# Patient Record
Sex: Female | Born: 1995 | Hispanic: No | Smoking: Current some day smoker
Health system: Southern US, Community
[De-identification: ages and names within clinical notes are randomized; demographics above are authoritative.]

---
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
## 2016-12-31 ENCOUNTER — Encounter (HOSPITAL_BASED_OUTPATIENT_CLINIC_OR_DEPARTMENT_OTHER): Payer: Self-pay | Admitting: Emergency Medicine

## 2016-12-31 ENCOUNTER — Emergency Department (HOSPITAL_BASED_OUTPATIENT_CLINIC_OR_DEPARTMENT_OTHER): Payer: No Typology Code available for payment source

## 2016-12-31 ENCOUNTER — Emergency Department (HOSPITAL_BASED_OUTPATIENT_CLINIC_OR_DEPARTMENT_OTHER)
Admission: EM | Admit: 2016-12-31 | Discharge: 2016-12-31 | Disposition: A | Discharge status: Home / Self Care | Payer: No Typology Code available for payment source | Attending: Emergency Medicine | Admitting: Emergency Medicine

## 2016-12-31 ENCOUNTER — Other Ambulatory Visit: Payer: Self-pay

## 2016-12-31 DIAGNOSIS — M25511 Pain in right shoulder: Secondary | ICD-10-CM | POA: Insufficient documentation

## 2016-12-31 DIAGNOSIS — Y92410 Unspecified street and highway as the place of occurrence of the external cause: Secondary | ICD-10-CM | POA: Diagnosis not present

## 2016-12-31 DIAGNOSIS — Y999 Unspecified external cause status: Secondary | ICD-10-CM | POA: Diagnosis not present

## 2016-12-31 DIAGNOSIS — S161XXA Strain of muscle, fascia and tendon at neck level, initial encounter: Secondary | ICD-10-CM | POA: Insufficient documentation

## 2016-12-31 DIAGNOSIS — Y939 Activity, unspecified: Secondary | ICD-10-CM | POA: Diagnosis not present

## 2016-12-31 DIAGNOSIS — S199XXA Unspecified injury of neck, initial encounter: Secondary | ICD-10-CM | POA: Diagnosis present

## 2016-12-31 MED ORDER — CYCLOBENZAPRINE HCL 5 MG PO TABS
5.0000 mg | ORAL_TABLET | Freq: Once | ORAL | Status: AC
  Administered 2016-12-31: 5 mg via ORAL
  Filled 2016-12-31: qty 1

## 2016-12-31 MED ORDER — NAPROXEN 375 MG PO TABS: 375.0000 mg | ORAL_TABLET | Freq: Two times a day (BID) | ORAL | 0 refills | Status: DC

## 2016-12-31 MED ORDER — CYCLOBENZAPRINE HCL 10 MG PO TABS: 10.0000 mg | ORAL_TABLET | Freq: Two times a day (BID) | ORAL | 0 refills | Status: DC | PRN

## 2016-12-31 MED ORDER — IBUPROFEN 400 MG PO TABS
600.0000 mg | ORAL_TABLET | Freq: Once | ORAL | Status: AC
  Administered 2016-12-31: 600 mg via ORAL
  Filled 2016-12-31: qty 1
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
## 2016-12-31 NOTE — Discharge Instructions (Signed)
Your imaging shows no fracture.  This likely musculoskeletal pain.  I would recommend warm compresses to the affected area.  Warm soaks and Epsom salt.  Please take the Naproxen as prescribed for pain. Do not take any additional NSAIDs including Motrin, Aleve, Ibuprofen, Advil.  Please the the Flexeril For muscle relaxation. This medication will make you drowsy so avoid situation that could place you in danger.   Please follow-up with your primary care doctor or return to the ED if your symptoms worsen or not improving.

## 2016-12-31 NOTE — ED Notes (Signed)
ED Provider at bedside. 
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
## 2019-08-21 ENCOUNTER — Other Ambulatory Visit: Payer: Self-pay

## 2019-08-21 ENCOUNTER — Emergency Department (HOSPITAL_BASED_OUTPATIENT_CLINIC_OR_DEPARTMENT_OTHER)
Admission: EM | Admit: 2019-08-21 | Discharge: 2019-08-21 | Disposition: A | Discharge status: Home / Self Care | Payer: 59 | Attending: Emergency Medicine | Admitting: Emergency Medicine

## 2019-08-21 ENCOUNTER — Encounter (HOSPITAL_BASED_OUTPATIENT_CLINIC_OR_DEPARTMENT_OTHER): Payer: Self-pay | Admitting: Emergency Medicine

## 2019-08-21 DIAGNOSIS — K0889 Other specified disorders of teeth and supporting structures: Secondary | ICD-10-CM | POA: Insufficient documentation

## 2019-08-21 MED ORDER — CLINDAMYCIN HCL 300 MG PO CAPS: 300.0000 mg | ORAL_CAPSULE | Freq: Three times a day (TID) | ORAL | 0 refills | Status: AC

## 2019-08-21 NOTE — ED Triage Notes (Signed)
"   I have a really bad toothache, this am when I woke up" Also states has a hole in her tooth

## 2019-08-21 NOTE — ED Provider Notes (Signed)
[facility] EMERGENCY DEPARTMENT Provider Note   CSN: [id number] Arrival date & time: 08/21/19  7782     History Chief Complaint  Patient presents with  . Dental Pain    Brittany Lyons is a 24 y.o. female.  The history is provided by the patient.  Dental Pain Location:  Upper Quality:  Aching Severity:  Mild Onset quality:  Gradual Timing:  Intermittent Progression:  Waxing and waning Chronicity:  New Context: abscess (feels like fluid just drained from upper tooth)   Relieved by:  Nothing Worsened by:  Nothing Associated symptoms: no congestion, no difficulty swallowing, no drooling, no facial pain, no facial swelling, no fever, no gum swelling, no headaches, no neck pain, no neck swelling, no oral bleeding, no oral lesions and no trismus        History reviewed. No pertinent past medical history.  Patient Active Problem List   Diagnosis Date Noted  . Rib pain on right side 10/26/2012    History reviewed. No pertinent surgical history.   OB History   No obstetric history on file.     No family history on file.  Social History   Tobacco Use  . Smoking status: Never Smoker  . Smokeless tobacco: Never Used  Substance Use Topics  . Alcohol use: No  . Drug use: Yes    Types: Marijuana    Home Medications Prior to Admission medications   Medication Sig Start Date End Date Taking? Authorizing Provider  clindamycin (CLEOCIN) 300 MG capsule Take 1 capsule (300 mg total) by mouth 3 (three) times daily for 10 days. 08/21/19 08/31/19  Ravinder Hofland, DO  cyclobenzaprine (FLEXERIL) 10 MG tablet Take 1 tablet (10 mg total) 2 (two) times daily as needed by mouth for muscle spasms. 12/31/16   Rise Mu, PA-C  naproxen (NAPROSYN) 375 MG tablet Take 1 tablet (375 mg total) 2 (two) times daily by mouth. 12/31/16   Rise Mu, PA-C    Allergies    Patient has no known allergies.  Review of Systems   Review of Systems  Constitutional:  Negative for fever.  HENT: Negative for congestion, drooling, facial swelling and mouth sores.   Musculoskeletal: Negative for neck pain.  Neurological: Negative for headaches.    Physical Exam Updated Vital Signs BP 122/85 (BP Location: Right Arm)   Pulse 63   Temp 99 F (37.2 C) (Oral)   Resp 18   Ht 4\' 11"  (1.499 m)   Wt 45.2 kg   LMP 07/24/2019   SpO2 100%   BMI 20.12 kg/m   Physical Exam Constitutional:      General: She is not in acute distress.    Appearance: She is not ill-appearing.  HENT:     Head: Normocephalic.     Comments: Overall good dentition, no obvious ongoing abscess, some gingival disease    Nose: Nose normal.     Mouth/Throat:     Mouth: Mucous membranes are moist.  Neurological:     Mental Status: She is alert.     ED Results / Procedures / Treatments   Labs (all labs ordered are listed, but only abnormal results are displayed) Labs Reviewed - No data to display  EKG None  Radiology No results found.  Procedures Procedures (including critical care time)  Medications Ordered in ED Medications - No data to display  ED Course  I have reviewed the triage vital signs and the nursing notes.  Pertinent labs & imaging results that were  available during my care of the patient were reviewed by me and considered in my medical decision making (see chart for details).    MDM Rules/Calculators/A&P                          Brittany Lyons is a 24 year old female with no significant medical history presents to the ED with dental pain.  Patient with normal vitals.  No fever.  Felt like she had some abscess type structure above her upper front tooth that drained earlier this morning.  Patient overall appears well.  No obvious ongoing infection/abscess.  Patient overall with healthy looking teeth.  Some mild gum disease.  Will treat with antibiotics and give her resources for dentistry.  No concern for deeper space infection or facial infection.   Understands return precautions.  Discharged in good condition.  This chart was dictated using voice recognition software.  Despite best efforts to proofread,  errors can occur which can change the documentation meaning.   Final Clinical Impression(s) / ED Diagnoses Final diagnoses:  Pain, dental    Rx / DC Orders ED Discharge Orders         Ordered    clindamycin (CLEOCIN) 300 MG capsule  3 times daily     Discontinue  Reprint     08/21/19 [place], [place], DO 08/21/19 [phone]

## 2020-05-23 ENCOUNTER — Ambulatory Visit (INDEPENDENT_AMBULATORY_CARE_PROVIDER_SITE_OTHER): Payer: Self-pay | Admitting: Medical-Surgical

## 2020-05-23 ENCOUNTER — Other Ambulatory Visit (HOSPITAL_COMMUNITY)
Admission: RE | Admit: 2020-05-23 | Discharge: 2020-05-23 | Disposition: A | Discharge status: Home / Self Care | Payer: Self-pay | Source: Ambulatory Visit | Attending: Medical-Surgical | Admitting: Medical-Surgical

## 2020-05-23 ENCOUNTER — Other Ambulatory Visit: Payer: Self-pay

## 2020-05-23 ENCOUNTER — Encounter: Payer: Self-pay | Admitting: Medical-Surgical

## 2020-05-23 VITALS — BP 106/67 | HR 80 | Temp 98.6°F | Ht 58.5 in | Wt 108.5 lb

## 2020-05-23 DIAGNOSIS — Z1159 Encounter for screening for other viral diseases: Secondary | ICD-10-CM

## 2020-05-23 DIAGNOSIS — Z124 Encounter for screening for malignant neoplasm of cervix: Secondary | ICD-10-CM

## 2020-05-23 DIAGNOSIS — Z0000 Encounter for general adult medical examination without abnormal findings: Secondary | ICD-10-CM

## 2020-05-23 DIAGNOSIS — R6889 Other general symptoms and signs: Secondary | ICD-10-CM

## 2020-05-23 DIAGNOSIS — Z114 Encounter for screening for human immunodeficiency virus [HIV]: Secondary | ICD-10-CM

## 2020-05-23 NOTE — Patient Instructions (Addendum)
Preventive Care 21-26 Years Old, Female Preventive care refers to lifestyle choices and visits with your health care provider that can promote health and wellness. This includes:  A yearly physical exam. This is also called an annual wellness visit.  Regular dental and eye exams.  Immunizations.  Screening for certain conditions.  Healthy lifestyle choices, such as: ? Eating a healthy diet. ? Getting regular exercise. ? Not using drugs or products that contain nicotine and tobacco. ? Limiting alcohol use. What can I expect for my preventive care visit? Physical exam Your health care provider may check your:  Height and weight. These may be used to calculate your BMI (body mass index). BMI is a measurement that tells if you are at a healthy weight.  Heart rate and blood pressure.  Body temperature.  Skin for abnormal spots. Counseling Your health care provider may ask you questions about your:  Past medical problems.  Family's medical history.  Alcohol, tobacco, and drug use.  Emotional well-being.  Home life and relationship well-being.  Sexual activity.  Diet, exercise, and sleep habits.  Work and work environment.  Access to firearms.  Method of birth control.  Menstrual cycle.  Pregnancy history. What immunizations do I need? Vaccines are usually given at various ages, according to a schedule. Your health care provider will recommend vaccines for you based on your age, medical history, and lifestyle or other factors, such as travel or where you work.   What tests do I need? Blood tests  Lipid and cholesterol levels. These may be checked every 5 years starting at age 20.  Hepatitis C test.  Hepatitis B test. Screening  Diabetes screening. This is done by checking your blood sugar (glucose) after you have not eaten for a while (fasting).  STD (sexually transmitted disease) testing, if you are at risk.  BRCA-related cancer screening. This may be  done if you have a family history of breast, ovarian, tubal, or peritoneal cancers.  Pelvic exam and Pap test. This may be done every 3 years starting at age 21. Starting at age 30, this may be done every 5 years if you have a Pap test in combination with an HPV test. Talk with your health care provider about your test results, treatment options, and if necessary, the need for more tests.   Follow these instructions at home: Eating and drinking  Eat a healthy diet that includes fresh fruits and vegetables, whole grains, lean protein, and low-fat dairy products.  Take vitamin and mineral supplements as recommended by your health care provider.  Do not drink alcohol if: ? Your health care provider tells you not to drink. ? You are pregnant, may be pregnant, or are planning to become pregnant.  If you drink alcohol: ? Limit how much you have to 0-1 drink a day. ? Be aware of how much alcohol is in your drink. In the [place] one drink equals one 12 oz bottle of beer (355 mL), one 5 oz glass of wine (148 mL), or one 1 oz glass of hard liquor (44 mL).   Lifestyle  Take daily care of your teeth and gums. Brush your teeth every morning and night with fluoride toothpaste. Floss one time each day.  Stay active. Exercise for at least 30 minutes 5 or more days each week.  Do not use any products that contain nicotine or tobacco, such as cigarettes, e-cigarettes, and chewing tobacco. If you need help quitting, ask your health care provider.  Do not   use drugs.  If you are sexually active, practice safe sex. Use a condom or other form of protection to prevent STIs (sexually transmitted infections).  If you do not wish to become pregnant, use a form of birth control. If you plan to become pregnant, see your health care provider for a prepregnancy visit.  Find healthy ways to cope with stress, such as: ? Meditation, yoga, or listening to music. ? Journaling. ? Talking to a trusted  person. ? Spending time with friends and family. Safety  Always wear your seat belt while driving or riding in a vehicle.  Do not drive: ? If you have been drinking alcohol. Do not ride with someone who has been drinking. ? When you are tired or distracted. ? While texting.  Wear a helmet and other protective equipment during sports activities.  If you have firearms in your house, make sure you follow all gun safety procedures.  Seek help if you have been physically or sexually abused. What's next?  Go to your health care provider once a year for an annual wellness visit.  Ask your health care provider how often you should have your eyes and teeth checked.  Stay up to date on all vaccines. This information is not intended to replace advice given to you by your health care provider. Make sure you discuss any questions you have with your health care provider. Document Revised: 10/09/2019 Document Reviewed: 10/22/2017 Elsevier Patient Education  2021 Elsevier Inc.  

## 2020-05-23 NOTE — Progress Notes (Signed)
New Patient Office Visit  Subjective:  Patient ID: Brittany Lyons, female    DOB: February 07, 1996  Age: 25 y.o. MRN: [id number]  CC:  Chief Complaint  Patient presents with  . Establish Care  . Annual Exam    Including Pap smear    HPI Brittany Lyons presents to establish care.   Would like to have her pap smear completed today. Has never had a pap smear. She is sexually active but has only had 1 female partner. Recently has had female partners. No current vaginal symptoms or concerns but would like to add STI testing onto pap if possible.   Smokes marijuana approximately 5 days per week. Reports this helps with her anxiety.   Would like to be checked for anemia because she stays cold all the time. Also has difficulty gaining weight. Reports that her mom has thyroid problems. Unsure about her other family history since her mom was adopted.   History reviewed. No pertinent past medical history.  History reviewed. No pertinent surgical history.  Family History  Problem Relation Age of Onset  . Thyroid disease Mother     Social History   Socioeconomic History  . Marital status: Not on file    Spouse name: Not on file  . Number of children: Not on file  . Years of education: Not on file  . Highest education level: Not on file  Occupational History  . Not on file  Tobacco Use  . Smoking status: Current Some Day Smoker  . Smokeless tobacco: Never Used  Substance and Sexual Activity  . Alcohol use: Not Currently  . Drug use: Yes    Frequency: 5.0 times per week    Types: Marijuana  . Sexual activity: Yes    Birth control/protection: None  Other Topics Concern  . Not on file  Social History Narrative  . Not on file   Social Determinants of Health   Financial Resource Strain: Not on file  Food Insecurity: Not on file  Transportation Needs: Not on file  Physical Activity: Not on file  Stress: Not on file  Social Connections: Not on file  Intimate Partner Violence: Not  on file    ROS Review of Systems  Objective:   Today's Vitals: BP 106/67   Pulse 80   Temp 98.6 F (37 C)   Ht 4' 10.5" (1.486 m)   Wt 108 lb 8 oz (49.2 kg)   LMP 05/18/2020   SpO2 97%   BMI 22.29 kg/m   Physical Exam Vitals reviewed. Exam conducted with a chaperone present.  Constitutional:      General: She is not in acute distress.    Appearance: Normal appearance. She is normal weight. She is not ill-appearing.  HENT:     Head: Normocephalic and atraumatic.  Eyes:     Extraocular Movements: Extraocular movements intact.     Conjunctiva/sclera: Conjunctivae normal.     Pupils: Pupils are equal, round, and reactive to light.  Cardiovascular:     Rate and Rhythm: Normal rate and regular rhythm.     Pulses: Normal pulses.     Heart sounds: Normal heart sounds. No murmur heard. No friction rub. No gallop.   Pulmonary:     Effort: Pulmonary effort is normal. No respiratory distress.     Breath sounds: Normal breath sounds. No wheezing.  Abdominal:     General: Abdomen is flat. Bowel sounds are normal.     Palpations: Abdomen is soft.  Genitourinary:  Exam position: Lithotomy position.     Pubic Area: No rash or pubic lice.      Labia:        Right: No rash, tenderness, lesion or injury.        Left: No rash, tenderness, lesion or injury.      Urethra: No prolapse, urethral pain, urethral swelling or urethral lesion.     Vagina: Normal.     Cervix: Normal.     Uterus: Normal.      Adnexa: Right adnexa normal and left adnexa normal.     Comments: Pap smear completed. Glennie Hawk, MA served as Producer, television/film/video.  Musculoskeletal:        General: Normal range of motion.     Cervical back: Normal range of motion and neck supple. No rigidity or tenderness.  Skin:    General: Skin is warm and dry.  Neurological:     Mental Status: She is alert and oriented to person, place, and time.  Psychiatric:        Mood and Affect: Mood normal.        Behavior: Behavior  normal.        Thought Content: Thought content normal.        Judgment: Judgment normal.     Assessment & Plan:   1. Encounter for medical examination to establish care Reviewed available information and discussed healthcare concerns with patient.  2. Cold intolerance Checking CBC with differential and TSH. - CBC with Differential/Platelet - TSH  3. Screening for cervical cancer Pap smear completed with STI testing added per patient request. - Cytology - PAP  4. Screening for HIV (human immunodeficiency virus) 5. Need for hepatitis C screening test Discussed screening recommendations.  Patient agreeable so adding to blood work today. - Hepatitis C antibody - HIV Antibody (routine testing w rflx)  6. Annual physical exam Checking CBC with differential, CMP, and lipid panel today. - CBC with Differential/Platelet - COMPLETE METABOLIC PANEL WITH GFR - Lipid panel  No outpatient encounter medications on file as of 05/23/2020.   No facility-administered encounter medications on file as of 05/23/2020.    Patient Instructions   Preventive Care 97-22 Years Old, Female Preventive care refers to lifestyle choices and visits with your health care provider that can promote health and wellness. This includes:  A yearly physical exam. This is also called an annual wellness visit.  Regular dental and eye exams.  Immunizations.  Screening for certain conditions.  Healthy lifestyle choices, such as: ? Eating a healthy diet. ? Getting regular exercise. ? Not using drugs or products that contain nicotine and tobacco. ? Limiting alcohol use. What can I expect for my preventive care visit? Physical exam Your health care provider may check your:  Height and weight. These may be used to calculate your BMI (body mass index). BMI is a measurement that tells if you are at a healthy weight.  Heart rate and blood pressure.  Body temperature.  Skin for abnormal  spots. Counseling Your health care provider may ask you questions about your:  Past medical problems.  Family's medical history.  Alcohol, tobacco, and drug use.  Emotional well-being.  Home life and relationship well-being.  Sexual activity.  Diet, exercise, and sleep habits.  Work and work Statistician.  Access to firearms.  Method of birth control.  Menstrual cycle.  Pregnancy history. What immunizations do I need? Vaccines are usually given at various ages, according to a schedule. Your health care provider will recommend  vaccines for you based on your age, medical history, and lifestyle or other factors, such as travel or where you work.   What tests do I need? Blood tests  Lipid and cholesterol levels. These may be checked every 5 years starting at age 39.  Hepatitis C test.  Hepatitis B test. Screening  Diabetes screening. This is done by checking your blood sugar (glucose) after you have not eaten for a while (fasting).  STD (sexually transmitted disease) testing, if you are at risk.  BRCA-related cancer screening. This may be done if you have a family history of breast, ovarian, tubal, or peritoneal cancers.  Pelvic exam and Pap test. This may be done every 3 years starting at age 53. Starting at age 52, this may be done every 5 years if you have a Pap test in combination with an HPV test. Talk with your health care provider about your test results, treatment options, and if necessary, the need for more tests.   Follow these instructions at home: Eating and drinking  Eat a healthy diet that includes fresh fruits and vegetables, whole grains, lean protein, and low-fat dairy products.  Take vitamin and mineral supplements as recommended by your health care provider.  Do not drink alcohol if: ? Your health care provider tells you not to drink. ? You are pregnant, may be pregnant, or are planning to become pregnant.  If you drink alcohol: ? Limit how  much you have to 0-1 drink a day. ? Be aware of how much alcohol is in your drink. In the [place] one drink equals one 12 oz bottle of beer (355 mL), one 5 oz glass of wine (148 mL), or one 1 oz glass of hard liquor (44 mL).   Lifestyle  Take daily care of your teeth and gums. Brush your teeth every morning and night with fluoride toothpaste. Floss one time each day.  Stay active. Exercise for at least 30 minutes 5 or more days each week.  Do not use any products that contain nicotine or tobacco, such as cigarettes, e-cigarettes, and chewing tobacco. If you need help quitting, ask your health care provider.  Do not use drugs.  If you are sexually active, practice safe sex. Use a condom or other form of protection to prevent STIs (sexually transmitted infections).  If you do not wish to become pregnant, use a form of birth control. If you plan to become pregnant, see your health care provider for a prepregnancy visit.  Find healthy ways to cope with stress, such as: ? Meditation, yoga, or listening to music. ? Journaling. ? Talking to a trusted person. ? Spending time with friends and family. Safety  Always wear your seat belt while driving or riding in a vehicle.  Do not drive: ? If you have been drinking alcohol. Do not ride with someone who has been drinking. ? When you are tired or distracted. ? While texting.  Wear a helmet and other protective equipment during sports activities.  If you have firearms in your house, make sure you follow all gun safety procedures.  Seek help if you have been physically or sexually abused. What's next?  Go to your health care provider once a year for an annual wellness visit.  Ask your health care provider how often you should have your eyes and teeth checked.  Stay up to date on all vaccines. This information is not intended to replace advice given to you by your health care provider. Make sure  you discuss any questions you have with your  health care provider. Document Revised: 10/09/2019 Document Reviewed: 10/22/2017 Elsevier Patient Education  2021 [place].  Follow-up: Return if symptoms worsen or fail to improve.  Further follow-up pending lab results.  Clearnce Sorrel, DNP, APRN, FNP-BC [facility]

## 2020-05-28 LAB — CYTOLOGY - PAP
Adequacy: ABSENT
Chlamydia: POSITIVE — AB
Comment: NEGATIVE
Comment: NEGATIVE
Comment: NEGATIVE
Comment: NORMAL
Diagnosis: NEGATIVE
HSV1: NEGATIVE
HSV2: NEGATIVE
Neisseria Gonorrhea: NEGATIVE
Trichomonas: NEGATIVE

## 2020-05-28 MED ORDER — AZITHROMYCIN 250 MG PO TABS: ORAL_TABLET | ORAL | 0 refills | Status: DC

## 2020-05-28 NOTE — Addendum Note (Signed)
Addended byChristen Butter on: 05/28/2020 05:04 PM   Modules accepted: Orders

## 2020-06-17 ENCOUNTER — Other Ambulatory Visit: Payer: Self-pay | Admitting: Medical-Surgical

## 2020-06-24 ENCOUNTER — Emergency Department (HOSPITAL_BASED_OUTPATIENT_CLINIC_OR_DEPARTMENT_OTHER)
Admission: EM | Admit: 2020-06-24 | Discharge: 2020-06-24 | Disposition: A | Discharge status: Home / Self Care | Payer: Self-pay | Attending: Emergency Medicine | Admitting: Emergency Medicine

## 2020-06-24 ENCOUNTER — Encounter (HOSPITAL_BASED_OUTPATIENT_CLINIC_OR_DEPARTMENT_OTHER): Payer: Self-pay

## 2020-06-24 ENCOUNTER — Other Ambulatory Visit: Payer: Self-pay

## 2020-06-24 DIAGNOSIS — Z711 Person with feared health complaint in whom no diagnosis is made: Secondary | ICD-10-CM

## 2020-06-24 DIAGNOSIS — Z202 Contact with and (suspected) exposure to infections with a predominantly sexual mode of transmission: Secondary | ICD-10-CM | POA: Insufficient documentation

## 2020-06-24 DIAGNOSIS — R102 Pelvic and perineal pain: Secondary | ICD-10-CM | POA: Insufficient documentation

## 2020-06-24 LAB — URINALYSIS, ROUTINE W REFLEX MICROSCOPIC
Bilirubin Urine: NEGATIVE
Glucose, UA: NEGATIVE mg/dL
Hgb urine dipstick: NEGATIVE
Ketones, ur: NEGATIVE mg/dL
Nitrite: NEGATIVE
Protein, ur: NEGATIVE mg/dL
Specific Gravity, Urine: 1.02 (ref 1.005–1.030)
pH: 6.5 (ref 5.0–8.0)

## 2020-06-24 LAB — WET PREP, GENITAL
Clue Cells Wet Prep HPF POC: NONE SEEN
Sperm: NONE SEEN
Trich, Wet Prep: NONE SEEN
Yeast Wet Prep HPF POC: NONE SEEN

## 2020-06-24 LAB — URINALYSIS, MICROSCOPIC (REFLEX)

## 2020-06-24 LAB — PREGNANCY, URINE: Preg Test, Ur: NEGATIVE

## 2020-06-24 MED ORDER — ONDANSETRON 4 MG PO TBDP
4.0000 mg | ORAL_TABLET | Freq: Once | ORAL | Status: AC
  Administered 2020-06-24: 4 mg via ORAL
  Filled 2020-06-24: qty 1

## 2020-06-24 MED ORDER — CEFTRIAXONE SODIUM 500 MG IJ SOLR
500.0000 mg | Freq: Once | INTRAMUSCULAR | Status: AC
  Administered 2020-06-24: 500 mg via INTRAMUSCULAR
  Filled 2020-06-24: qty 500

## 2020-06-24 MED ORDER — DOXYCYCLINE HYCLATE 100 MG PO CAPS: 100.0000 mg | ORAL_CAPSULE | Freq: Two times a day (BID) | ORAL | 0 refills | Status: AC

## 2020-06-24 MED ORDER — LIDOCAINE HCL (PF) 1 % IJ SOLN
1.0000 mL | Freq: Once | INTRAMUSCULAR | Status: AC
  Administered 2020-06-24: 1 mL
  Filled 2020-06-24: qty 5

## 2020-06-24 MED ORDER — DOXYCYCLINE HYCLATE 100 MG PO TABS
100.0000 mg | ORAL_TABLET | Freq: Once | ORAL | Status: AC
  Administered 2020-06-24: 100 mg via ORAL
  Filled 2020-06-24: qty 1

## 2020-06-24 NOTE — ED Triage Notes (Signed)
Needs follow up check for recent positive STD.   Lower abdominal pain x1 week  Denies n/v/d

## 2020-06-24 NOTE — ED Provider Notes (Signed)
[facility] EMERGENCY DEPARTMENT Provider Note   CSN: [id number] Arrival date & time: 06/24/20  1657     History Chief Complaint  Patient presents with  . Abdominal Pain    Brittany Lyons is a 25 y.o. female who presents today for evaluation of recheck for positive STD.  She states that about 3 weeks ago she was treated for chlamydia however she vomited the pills.  She states that she called her doctor who told her she needed to wait 3 weeks and then get retested.  She states that it has been about 4 weeks however she has had lower abdominal pain for 1 week.  No diarrhea, nausea or vomiting aside from when she took the antibiotics.  She denies any fevers.  She states that she has not been sexually active since she tried to take the treatment.  She denies any dysuria, frequency or urgency. She declines testing for HIV and syphilis.  HPI     No past medical history on file.  Patient Active Problem List   Diagnosis Date Noted  . Rib pain on right side 10/26/2012    No past surgical history on file.   OB History   No obstetric history on file.     No family history on file.  Social History   Tobacco Use  . Smoking status: Never Smoker  . Smokeless tobacco: Never Used  Vaping Use  . Vaping Use: Never used  Substance Use Topics  . Alcohol use: No  . Drug use: Yes    Frequency: 7.0 times per week    Types: Marijuana    Comment: smokes daily    Home Medications Prior to Admission medications   Medication Sig Start Date End Date Taking? Authorizing Provider  doxycycline (VIBRAMYCIN) 100 MG capsule Take 1 capsule (100 mg total) by mouth 2 (two) times daily for 7 days. 06/24/20 07/01/20 Yes Cristina Gong, PA-C  cyclobenzaprine (FLEXERIL) 10 MG tablet Take 1 tablet (10 mg total) 2 (two) times daily as needed by mouth for muscle spasms. 12/31/16   Rise Mu, PA-C  naproxen (NAPROSYN) 375 MG tablet Take 1 tablet (375 mg total) 2 (two) times daily by  mouth. 12/31/16   Rise Mu, PA-C    Allergies    Patient has no known allergies.  Review of Systems   Review of Systems  Constitutional: Negative for chills and fever.  Respiratory: Negative for shortness of breath and stridor.   Cardiovascular: Negative for chest pain.  Gastrointestinal: Positive for abdominal pain. Negative for diarrhea, nausea and vomiting.  Genitourinary: Positive for pelvic pain. Negative for dysuria, frequency, genital sores, hematuria, menstrual problem, urgency, vaginal bleeding, vaginal discharge and vaginal pain.  Musculoskeletal: Negative for back pain and neck pain.  Neurological: Negative for headaches.  All other systems reviewed and are negative.   Physical Exam Updated Vital Signs BP 118/81 (BP Location: Right Arm)   Pulse 67   Temp 98 F (36.7 C) (Oral)   Resp 16   Ht 4\' 11"  (1.499 m)   Wt 52.2 kg   SpO2 100%   BMI 23.23 kg/m   Physical Exam Vitals and nursing note reviewed. Exam conducted with a chaperone present (Female RN).  Constitutional:      General: She is not in acute distress.    Appearance: She is not diaphoretic.  HENT:     Head: Normocephalic and atraumatic.  Eyes:     General: No scleral icterus.  Right eye: No discharge.        Left eye: No discharge.     Conjunctiva/sclera: Conjunctivae normal.  Cardiovascular:     Rate and Rhythm: Normal rate and regular rhythm.  Pulmonary:     Effort: Pulmonary effort is normal. No respiratory distress.     Breath sounds: No stridor.  Abdominal:     General: Bowel sounds are normal. There is no distension.     Tenderness: There is no abdominal tenderness. There is no guarding or rebound.     Hernia: No hernia is present.  Genitourinary:    Comments: Normal external female genitalia.  There is no adnexal tenderness or fullness.  There is no cervical motion tenderness.  There is a moderate amount of cervical discharge. Musculoskeletal:        General: No deformity.      Cervical back: Normal range of motion.  Skin:    General: Skin is warm and dry.  Neurological:     Mental Status: She is alert.     Motor: No abnormal muscle tone.  Psychiatric:        Behavior: Behavior normal.     ED Results / Procedures / Treatments   Labs (all labs ordered are listed, but only abnormal results are displayed) Labs Reviewed  WET PREP, GENITAL - Abnormal; Notable for the following components:      Result Value   WBC, Wet Prep HPF POC MANY (*)    All other components within normal limits  URINALYSIS, ROUTINE W REFLEX MICROSCOPIC - Abnormal; Notable for the following components:   Leukocytes,Ua SMALL (*)    All other components within normal limits  URINALYSIS, MICROSCOPIC (REFLEX) - Abnormal; Notable for the following components:   Bacteria, UA FEW (*)    All other components within normal limits  PREGNANCY, URINE  GC/CHLAMYDIA PROBE AMP ([place]) NOT AT [facility]    EKG None  Radiology No results found.  Procedures Procedures   Medications Ordered in ED Medications  cefTRIAXone (ROCEPHIN) injection 500 mg (500 mg Intramuscular Given 06/24/20 1856)  lidocaine (PF) (XYLOCAINE) 1 % injection 1 mL (1 mL Other Given 06/24/20 1856)  ondansetron (ZOFRAN-ODT) disintegrating tablet 4 mg (4 mg Oral Given 06/24/20 1855)  doxycycline (VIBRA-TABS) tablet 100 mg (100 mg Oral Given 06/24/20 1855)    ED Course  I have reviewed the triage vital signs and the nursing notes.  Pertinent labs & imaging results that were available during my care of the patient were reviewed by me and considered in my medical decision making (see chart for details).    MDM Rules/Calculators/A&P                         Patient is a 25 year old woman who presents today for evaluation of pelvic pain.  She had a recent positive STD however states that she threw up the pills. On exam she is awake and alert, generally well-appearing.  Abdomen is soft, nontender, nondistended.  Pelvic exam  performed without cervical motion tenderness, adnexal tenderness or pain. UA without evidence of infection, pregnancy test is negative.  Wet prep is obtained showing many white blood cells without clear cause.  GC testing is sent.  Given that patient did not clearly have full treatment after positive test, combined with her having many white cells on her wet prep she is given IM Rocephin and a prescription for doxycycline to finish out the course.  Doubt PID at this time.  Return precautions were discussed with patient who states their understanding.  At the time of discharge patient denied any unaddressed complaints or concerns.  Patient is agreeable for discharge home.  Note: Portions of this report may have been transcribed using voice recognition software. Every effort was made to ensure accuracy; however, inadvertent computerized transcription errors may be present  Final Clinical Impression(s) / ED Diagnoses Final diagnoses:  Concern about STD in female without diagnosis    Rx / DC Orders ED Discharge Orders         Ordered    doxycycline (VIBRAMYCIN) 100 MG capsule  2 times daily        06/24/20 1913           Norman Clay 06/25/20 0111    Sabino Donovan, MD 06/29/20 [phone]

## 2020-06-24 NOTE — ED Notes (Signed)
Chaperone for EDP for pelvic exam. Pt tolerated well.  ?

## 2020-06-24 NOTE — ED Provider Notes (Incomplete)
  [facility] EMERGENCY DEPARTMENT Provider Note   CSN: [id number] Arrival date & time: 06/24/20  1657     History No chief complaint on file.   Brittany Lyons is a 25 y.o. female   HPI     No past medical history on file.  Patient Active Problem List   Diagnosis Date Noted  . Rib pain on right side 10/26/2012    No past surgical history on file.   OB History   No obstetric history on file.     No family history on file.  Social History   Tobacco Use  . Smoking status: Never Smoker  . Smokeless tobacco: Never Used  Vaping Use  . Vaping Use: Never used  Substance Use Topics  . Alcohol use: No  . Drug use: Yes    Frequency: 7.0 times per week    Types: Marijuana    Comment: smokes daily    Home Medications Prior to Admission medications   Medication Sig Start Date End Date Taking? Authorizing Provider  cyclobenzaprine (FLEXERIL) 10 MG tablet Take 1 tablet (10 mg total) 2 (two) times daily as needed by mouth for muscle spasms. 12/31/16   Rise Mu, PA-C  naproxen (NAPROSYN) 375 MG tablet Take 1 tablet (375 mg total) 2 (two) times daily by mouth. 12/31/16   Rise Mu, PA-C    Allergies    Patient has no known allergies.  Review of Systems   Review of Systems  Physical Exam Updated Vital Signs BP 113/85 (BP Location: Left Arm)   Pulse 80   Temp 98 F (36.7 C) (Oral)   Resp 16   Ht 4\' 11"  (1.499 m)   Wt 52.2 kg   SpO2 98%   BMI 23.23 kg/m   Physical Exam  ED Results / Procedures / Treatments   Labs (all labs ordered are listed, but only abnormal results are displayed) Labs Reviewed - No data to display  EKG None  Radiology No results found.  Procedures Procedures {Remember to document critical care time when appropriate:1}  Medications Ordered in ED Medications - No data to display  ED Course  I have reviewed the triage vital signs and the nursing notes.  Pertinent labs & imaging results that were  available during my care of the patient were reviewed by me and considered in my medical decision making (see chart for details).    MDM Rules/Calculators/A&P                          *** Final Clinical Impression(s) / ED Diagnoses Final diagnoses:  None    Rx / DC Orders ED Discharge Orders    None

## 2020-06-24 NOTE — Discharge Instructions (Addendum)
You may have diarrhea from the antibiotics.  It is very important that you continue to take the antibiotics even if you get diarrhea unless a medical professional tells you that you may stop taking them.  If you stop too early the bacteria you are being treated for will become stronger and you may need different, more powerful antibiotics that have more side effects and worsening diarrhea.  Please stay well hydrated and consider probiotics as they may decrease the severity of your diarrhea.  Please be aware that if you take any hormonal contraception (birth control pills, nexplanon, the ring, etc) that your birth control will not work while you are taking antibiotics and you need to use back up protection as directed on the birth control medication information insert.   If you develop any fevers, have any new or concerning symptoms please seek additional medical care and evaluation.

## 2020-06-26 ENCOUNTER — Encounter (HOSPITAL_BASED_OUTPATIENT_CLINIC_OR_DEPARTMENT_OTHER): Payer: Self-pay

## 2020-06-26 LAB — GC/CHLAMYDIA PROBE AMP (~~LOC~~) NOT AT ARMC
Chlamydia: NEGATIVE
Comment: NEGATIVE
Comment: NORMAL
Neisseria Gonorrhea: POSITIVE — AB

## 2020-08-06 ENCOUNTER — Encounter (HOSPITAL_BASED_OUTPATIENT_CLINIC_OR_DEPARTMENT_OTHER): Payer: Self-pay | Admitting: Urology

## 2020-08-06 ENCOUNTER — Other Ambulatory Visit (HOSPITAL_BASED_OUTPATIENT_CLINIC_OR_DEPARTMENT_OTHER): Payer: Self-pay

## 2020-08-06 ENCOUNTER — Other Ambulatory Visit: Payer: Self-pay

## 2020-08-06 ENCOUNTER — Emergency Department (HOSPITAL_BASED_OUTPATIENT_CLINIC_OR_DEPARTMENT_OTHER)
Admission: EM | Admit: 2020-08-06 | Discharge: 2020-08-06 | Disposition: A | Discharge status: Home / Self Care | Payer: Self-pay | Attending: Emergency Medicine | Admitting: Emergency Medicine

## 2020-08-06 DIAGNOSIS — J029 Acute pharyngitis, unspecified: Secondary | ICD-10-CM | POA: Insufficient documentation

## 2020-08-06 DIAGNOSIS — B9689 Other specified bacterial agents as the cause of diseases classified elsewhere: Secondary | ICD-10-CM | POA: Insufficient documentation

## 2020-08-06 DIAGNOSIS — K029 Dental caries, unspecified: Secondary | ICD-10-CM | POA: Insufficient documentation

## 2020-08-06 DIAGNOSIS — N760 Acute vaginitis: Secondary | ICD-10-CM | POA: Insufficient documentation

## 2020-08-06 LAB — WET PREP, GENITAL
Sperm: NONE SEEN
Trich, Wet Prep: NONE SEEN
Yeast Wet Prep HPF POC: NONE SEEN

## 2020-08-06 LAB — PREGNANCY, URINE: Preg Test, Ur: NEGATIVE

## 2020-08-06 MED ORDER — CEFTRIAXONE SODIUM 500 MG IJ SOLR
500.0000 mg | Freq: Once | INTRAMUSCULAR | Status: AC
  Administered 2020-08-06: 500 mg via INTRAMUSCULAR
  Filled 2020-08-06: qty 500

## 2020-08-06 MED ORDER — CLINDAMYCIN HCL 150 MG PO CAPS
300.0000 mg | ORAL_CAPSULE | Freq: Two times a day (BID) | ORAL | 0 refills | Status: AC
  Filled 2020-08-06: qty 28, 7d supply, fill #0

## 2020-08-06 MED ORDER — DOXYCYCLINE HYCLATE 100 MG PO CAPS
100.0000 mg | ORAL_CAPSULE | Freq: Two times a day (BID) | ORAL | 0 refills | Status: AC
  Filled 2020-08-06: qty 14, 7d supply, fill #0

## 2020-08-06 NOTE — ED Triage Notes (Signed)
Pt reports here for "swollen lymph nodes" to right side of throat noticed yesterday, denies fever.  Pain with swallowing. wants to be checked for STD's, treated last month for chlamydia, having clear vaginal discharge. Denies new exposure since treatment

## 2020-08-06 NOTE — ED Provider Notes (Signed)
[facility] EMERGENCY DEPARTMENT Provider Note   CSN: [id number] Arrival date & time: 08/06/20  0747     History Chief Complaint  Patient presents with   Exposure to STD   Lymphadenopathy    Brittany Lyons is a 25 y.o. female.  25 year old female who presents with right throat pain and vaginal discharge.  Patient reports history of right upper tooth problem.  Several days ago she felt like she got some food caught in the broken tooth and it was sore with swollen gums.  Yesterday she began having sore throat on the right side only and felt like her lymph nodes were swollen on that side.  She reports pain with swallowing but no difficulty swallowing and no cough, nasal congestion, fevers, sick contacts, vomiting, diarrhea, or swelling.  Patient also notes several days of clear vaginal discharge.  She reports history of chlamydia.  No abdominal pain or urinary symptoms.  She recently began having sexual intercourse with a previous partner again.  The history is provided by the patient.  Exposure to STD      History reviewed. No pertinent past medical history.  Patient Active Problem List   Diagnosis Date Noted   Rib pain on right side 10/26/2012    History reviewed. No pertinent surgical history.   OB History   No obstetric history on file.     Family History  Problem Relation Age of Onset   Thyroid disease Mother     Social History   Tobacco Use   Smoking status: Never   Smokeless tobacco: Never  Vaping Use   Vaping Use: Never used  Substance Use Topics   Alcohol use: No   Drug use: Yes    Frequency: 7.0 times per week    Types: Marijuana    Comment: smokes daily    Home Medications Prior to Admission medications   Medication Sig Start Date End Date Taking? Authorizing Provider  clindamycin (CLEOCIN) 150 MG capsule Take 2 capsules (300 mg total) by mouth in the morning and at bedtime for 7 days. 08/06/20 08/13/20 Yes Alysiah Suppa, Ambrose Finland, MD   doxycycline (VIBRAMYCIN) 100 MG capsule Take 1 capsule (100 mg total) by mouth 2 (two) times daily. 08/06/20  Yes Danille Oppedisano, Ambrose Finland, MD    Allergies    Patient has no known allergies.  Review of Systems   Review of Systems All other systems reviewed and are negative except that which was mentioned in HPI  Physical Exam Updated Vital Signs BP 122/85 (BP Location: Right Arm)   Pulse 98   Temp 98.3 F (36.8 C) (Oral)   Resp 18   Ht 4\' 11"  (1.499 m)   Wt 52.2 kg   SpO2 100%   BMI 23.23 kg/m   Physical Exam Vitals and nursing note reviewed. Exam conducted with a chaperone present.  Constitutional:      General: She is not in acute distress.    Appearance: Normal appearance.  HENT:     Head: Normocephalic and atraumatic.     Right Ear: Tympanic membrane normal.     Left Ear: Tympanic membrane normal.     Nose: Nose normal.     Mouth/Throat:     Mouth: Mucous membranes are moist.     Pharynx: Oropharynx is clear.     Comments: Tonsils symmetric w/ no erythema or exudates, uvula midline; R upper molar broken off at gumline w/ mucosal erythema, no edema or fluctuance, no facial swelling Eyes:  Conjunctiva/sclera: Conjunctivae normal.  Pulmonary:     Effort: Pulmonary effort is normal.  Abdominal:     General: Abdomen is flat. There is no distension.     Palpations: Abdomen is soft.     Tenderness: There is no abdominal tenderness.  Genitourinary:    Vagina: Vaginal discharge present.     Cervix: Erythema present. No cervical motion tenderness.     Uterus: Normal.      Adnexa: Right adnexa normal and left adnexa normal.     Comments: Small amount of white discharge in vaginal vault Musculoskeletal:     Cervical back: Neck supple. No tenderness.     Right lower leg: No edema.     Left lower leg: No edema.  Lymphadenopathy:     Cervical: No cervical adenopathy.  Skin:    General: Skin is warm and dry.  Neurological:     Mental Status: She is alert and oriented  to person, place, and time.     Comments: fluent  Psychiatric:        Mood and Affect: Mood normal.        Behavior: Behavior normal.    ED Results / Procedures / Treatments   Labs (all labs ordered are listed, but only abnormal results are displayed) Labs Reviewed  WET PREP, GENITAL - Abnormal; Notable for the following components:      Result Value   Clue Cells Wet Prep HPF POC PRESENT (*)    WBC, Wet Prep HPF POC MANY (*)    All other components within normal limits  PREGNANCY, URINE  GC/CHLAMYDIA PROBE AMP ([place]) NOT AT [facility]    EKG None  Radiology No results found.  Procedures Procedures   Medications Ordered in ED Medications  cefTRIAXone (ROCEPHIN) injection 500 mg (500 mg Intramuscular Given 08/06/20 7939)    ED Course  I have reviewed the triage vital signs and the nursing notes.  Pertinent labs & imaging results that were available during my care of the patient were reviewed by me and considered in my medical decision making (see chart for details).    MDM Rules/Calculators/A&P                          R upper tooth broken, no evidence of abscess but throat appearance is normal, pain may be due to tooth problem. Will start on abx and recommended dental f/u ASAP.    Patient with some cervical erythema on exam, no evidence of PID.  Recommended OB/GYN follow-up for Pap smear.  Offered empiric coverage for GC and chlamydia with ceftriaxone and doxycycline.  She does have evidence of BV on wet prep.  Will prescribe clindamycin to cover both for BV and odontogenic infection.  I have discussed supportive measures for her symptoms and extensively reviewed return precautions.  She voiced understanding. Final Clinical Impression(s) / ED Diagnoses Final diagnoses:  Sore throat  Dental caries  BV (bacterial vaginosis)    Rx / DC Orders ED Discharge Orders          Ordered    clindamycin (CLEOCIN) 150 MG capsule  2 times daily        08/06/20 0842     doxycycline (VIBRAMYCIN) 100 MG capsule  2 times daily        08/06/20 0842             Alaze Garverick, Ambrose Finland, MD 08/06/20 1306

## 2020-08-07 ENCOUNTER — Telehealth: Payer: Self-pay | Admitting: General Practice

## 2020-08-07 LAB — GC/CHLAMYDIA PROBE AMP (~~LOC~~) NOT AT ARMC
Chlamydia: NEGATIVE
Comment: NEGATIVE
Comment: NORMAL
Neisseria Gonorrhea: POSITIVE — AB

## 2020-08-07 NOTE — Telephone Encounter (Signed)
Transition Care Management Unsuccessful Follow-up Telephone Call  Date of discharge and from where:  [facility] 08/06/20  Attempts:  1st Attempt  Reason for unsuccessful TCM follow-up call:  Left voice message

## 2020-08-08 NOTE — Telephone Encounter (Signed)
Transition Care Management Unsuccessful Follow-up Telephone Call  Date of discharge and from where:  [facility]  08/06/20  Attempts:  2nd Attempt  Reason for unsuccessful TCM follow-up call:  No answer/busy

## 2020-08-09 NOTE — Telephone Encounter (Signed)
Transition Care Management Unsuccessful Follow-up Telephone Call  Date of discharge and from where:  08/06/20 from [facility]  Attempts:  3rd Attempt  Reason for unsuccessful TCM follow-up call:  Unable to reach patient

## 2020-08-21 ENCOUNTER — Ambulatory Visit: Payer: Self-pay | Admitting: Nurse Practitioner

## 2022-09-26 ENCOUNTER — Other Ambulatory Visit (HOSPITAL_COMMUNITY): Payer: Self-pay
